# Patient Record
Sex: Male | Born: 1990 | Race: Black or African American | Hispanic: No | Marital: Single | Smoking: Current some day smoker
Health system: Southern US, Community
[De-identification: ages and names within clinical notes are randomized; demographics above are authoritative.]

## PROBLEM LIST (undated history)

## (undated) DIAGNOSIS — Y249XXA Unspecified firearm discharge, undetermined intent, initial encounter: Secondary | ICD-10-CM

## (undated) DIAGNOSIS — W3400XA Accidental discharge from unspecified firearms or gun, initial encounter: Secondary | ICD-10-CM

---
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
## 2016-07-10 ENCOUNTER — Emergency Department (HOSPITAL_COMMUNITY): Payer: Self-pay

## 2016-07-10 ENCOUNTER — Encounter (HOSPITAL_COMMUNITY): Payer: Self-pay | Admitting: Emergency Medicine

## 2016-07-10 ENCOUNTER — Emergency Department (HOSPITAL_COMMUNITY)
Admission: EM | Admit: 2016-07-10 | Discharge: 2016-07-11 | Disposition: A | Discharge status: Home / Self Care | Payer: Self-pay | Attending: Emergency Medicine | Admitting: Emergency Medicine

## 2016-07-10 DIAGNOSIS — W3400XA Accidental discharge from unspecified firearms or gun, initial encounter: Secondary | ICD-10-CM | POA: Insufficient documentation

## 2016-07-10 DIAGNOSIS — Y939 Activity, unspecified: Secondary | ICD-10-CM | POA: Insufficient documentation

## 2016-07-10 DIAGNOSIS — Y999 Unspecified external cause status: Secondary | ICD-10-CM | POA: Insufficient documentation

## 2016-07-10 DIAGNOSIS — F17200 Nicotine dependence, unspecified, uncomplicated: Secondary | ICD-10-CM | POA: Insufficient documentation

## 2016-07-10 DIAGNOSIS — Y929 Unspecified place or not applicable: Secondary | ICD-10-CM | POA: Insufficient documentation

## 2016-07-10 DIAGNOSIS — S91002A Unspecified open wound, left ankle, initial encounter: Secondary | ICD-10-CM | POA: Insufficient documentation

## 2016-07-10 MED ORDER — CEPHALEXIN 500 MG PO CAPS: 500.0000 mg | ORAL_CAPSULE | Freq: Four times a day (QID) | ORAL | 0 refills | Status: DC

## 2016-07-10 MED ORDER — HYDROCODONE-ACETAMINOPHEN 5-325 MG PO TABS: 1.0000 | ORAL_TABLET | Freq: Four times a day (QID) | ORAL | 0 refills | Status: DC | PRN

## 2016-07-10 MED ORDER — CEFAZOLIN IN D5W 1 GM/50ML IV SOLN
1.0000 g | Freq: Once | INTRAVENOUS | Status: AC
  Administered 2016-07-10: 1 g via INTRAVENOUS
  Filled 2016-07-10: qty 50

## 2016-07-10 MED ORDER — NAPROXEN 500 MG PO TABS: 500.0000 mg | ORAL_TABLET | Freq: Two times a day (BID) | ORAL | 0 refills | Status: DC
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
## 2016-07-10 NOTE — ED Notes (Signed)
Patient transported to X-ray 

## 2016-07-10 NOTE — Discharge Instructions (Signed)
I spoke with Dr Veda CanningSwintek this evening.  He reviewed your xray findings.  Please call his office to follow up with one of his colleagues on Monday.  He suggested seeing Dr Victorino DikeHewitt.

## 2016-07-10 NOTE — ED Notes (Signed)
Patient transported to CT 

## 2017-12-07 IMAGING — DX DG ANKLE COMPLETE 3+V*L*
3 series · 3 of 3 positions shown · non-contrast
Comparison: None.

CLINICAL DATA: Bleeding and pain left foot and ankle

EXAM:
LEFT ANKLE COMPLETE - 3+ VIEW

[ankle ap]
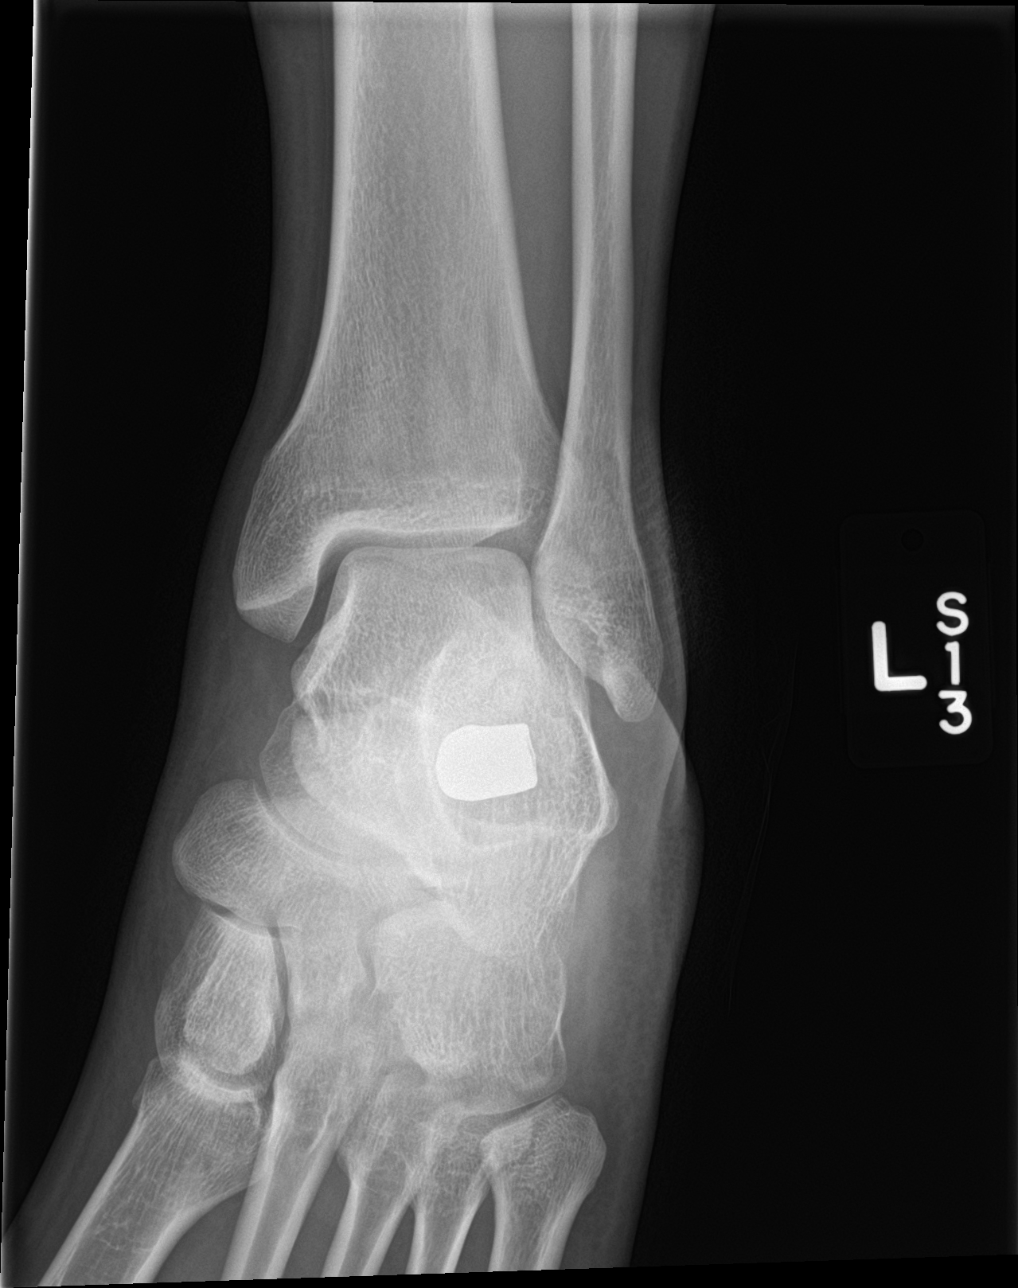

[ankle obl]
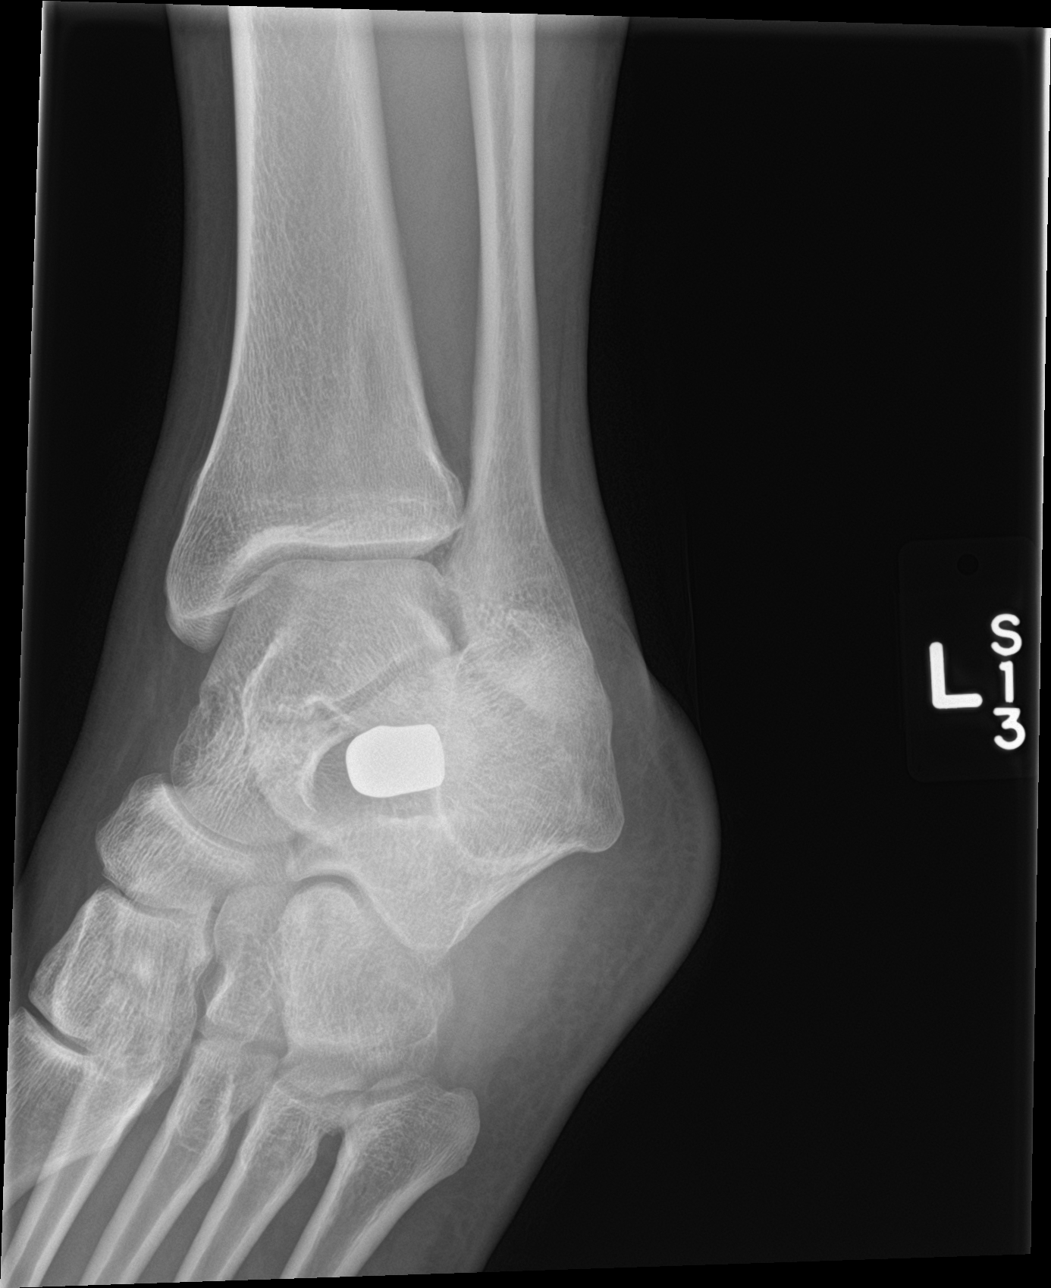

[ankle lat]
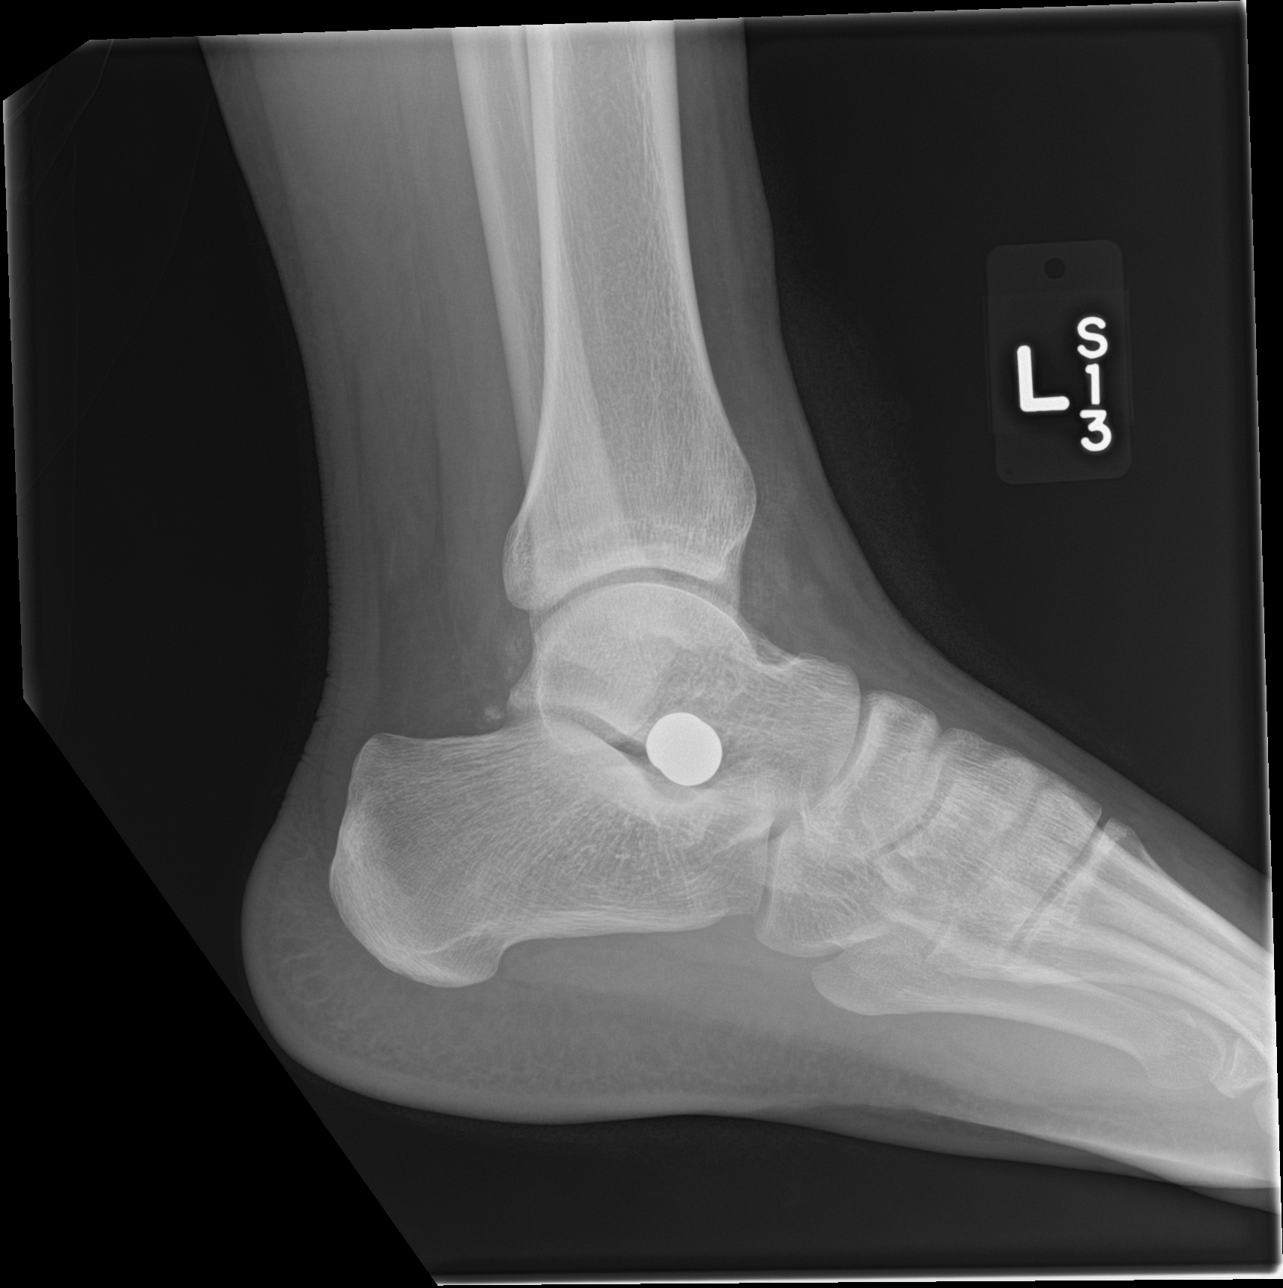

[3 of 3 positions shown; findings below may reference images not displayed]

FINDINGS: No acute fracture or dislocation at the left ankle joint. 1.5 cm
metallic foreign body projects over the talocalcaneal interval in
the region of sinus tarsi. Tiny calcifications posterior to the
talus may represent ossicle or nonspecific soft tissue
calcification.
IMPRESSION: No acute displaced fracture. Metallic density foreign body projects
over the talocalcaneal interview, in the region of sinus tarsi.

## 2017-12-07 IMAGING — DX DG FOOT COMPLETE 3+V*L*
3 series · 3 of 3 positions shown · non-contrast
Comparison: None.

CLINICAL DATA: Bleeding and pain left foot

EXAM:
LEFT FOOT - COMPLETE 3+ VIEW

[foot ap]
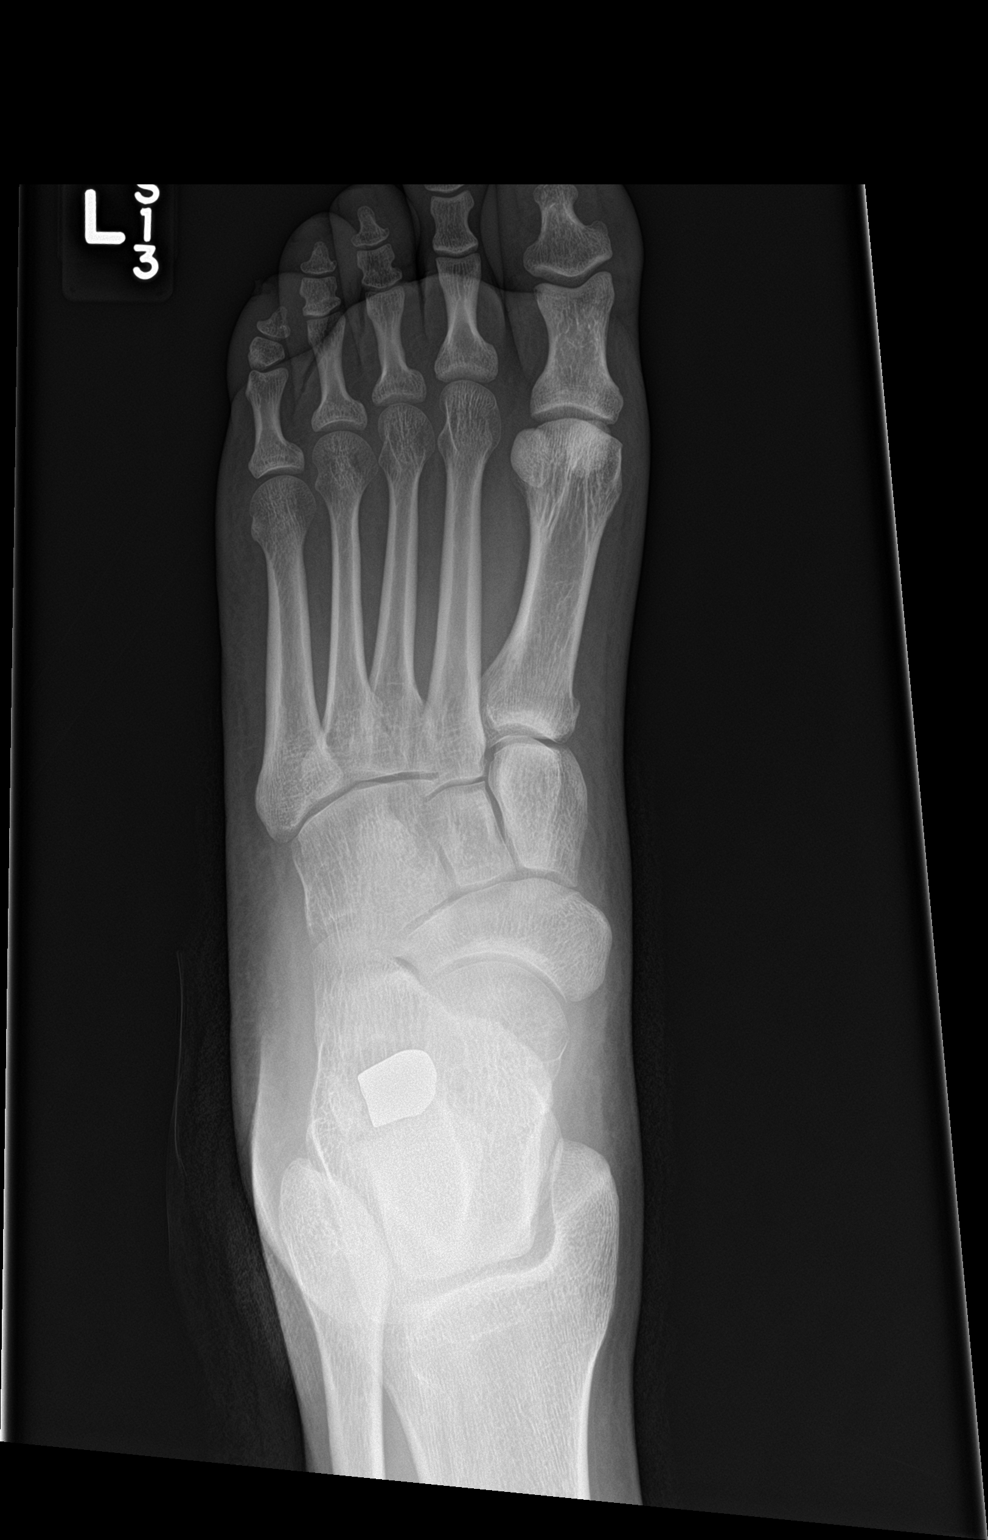

[foot obl]
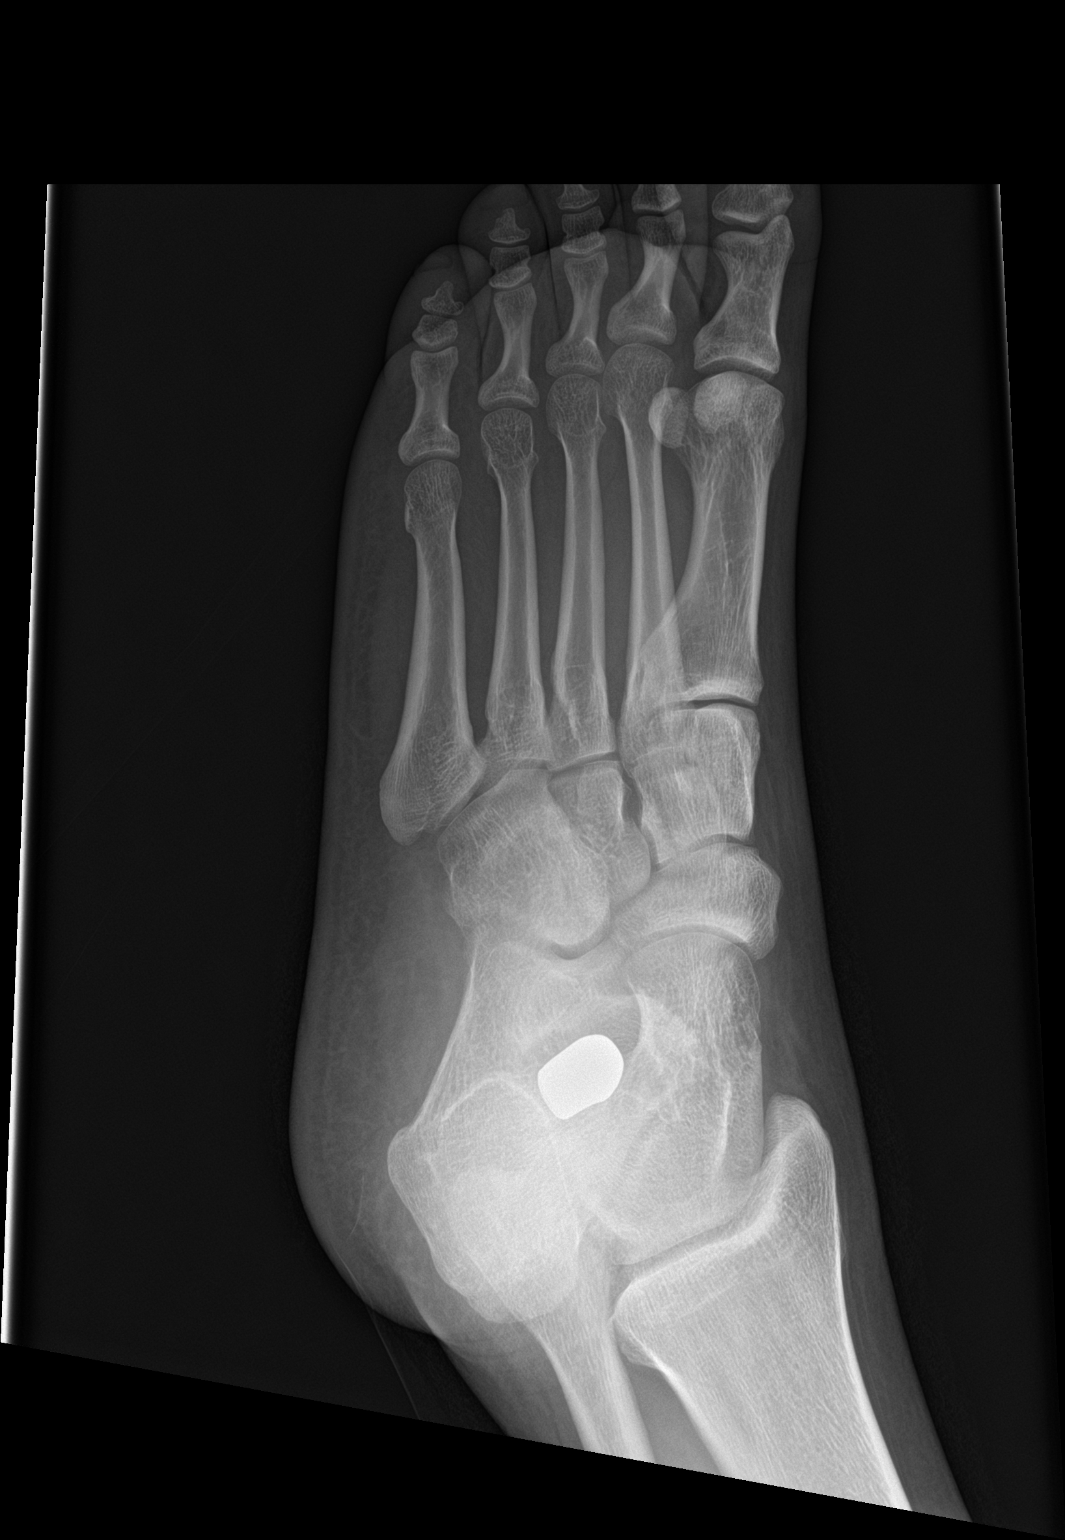

[foot lat]
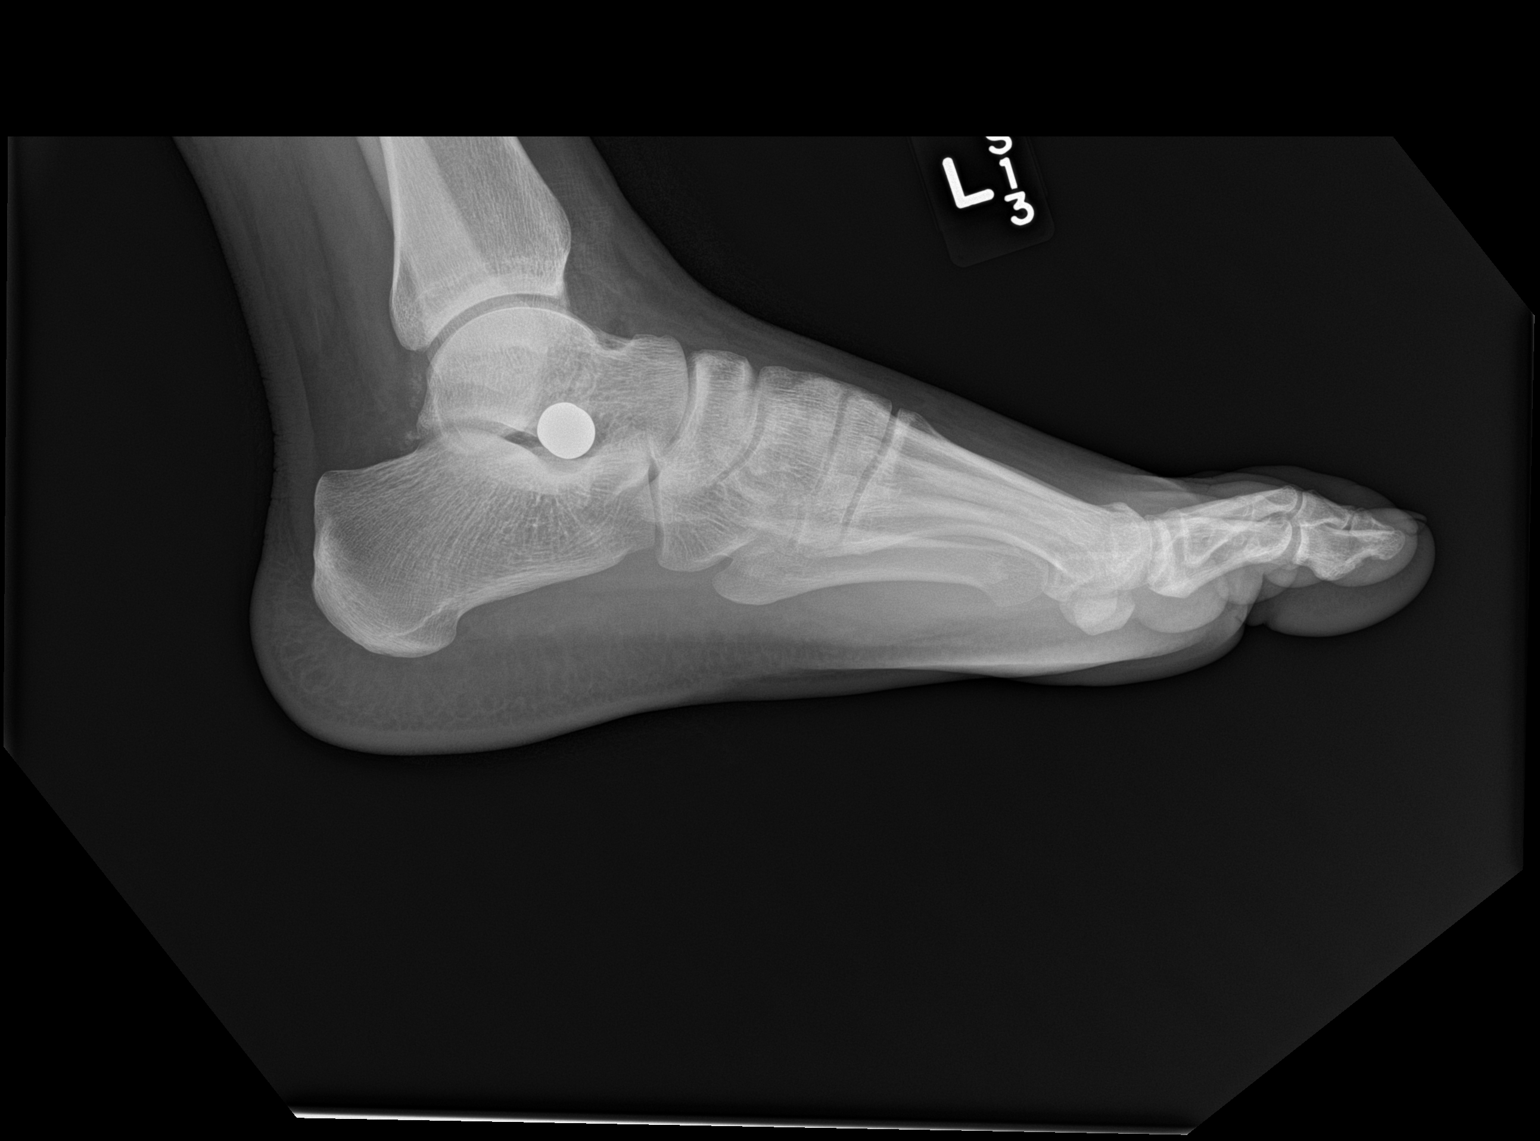

[3 of 3 positions shown; findings below may reference images not displayed]

FINDINGS: No acute displaced fracture or malalignment. Tiny calcifications
posterior to the talus. Metallic density foreign body projects over
the region of sinus tarsi.
IMPRESSION: No acute fracture or malalignment. Metallic density foreign body
between the calcaneus and the talus

## 2022-11-08 ENCOUNTER — Ambulatory Visit (HOSPITAL_COMMUNITY)
Admission: EM | Admit: 2022-11-08 | Discharge: 2022-11-08 | Disposition: A | Discharge status: Home / Self Care | Payer: BLUE CROSS/BLUE SHIELD | Attending: Family Medicine | Admitting: Family Medicine

## 2022-11-08 ENCOUNTER — Encounter (HOSPITAL_COMMUNITY): Payer: Self-pay | Admitting: Emergency Medicine

## 2022-11-08 DIAGNOSIS — K0889 Other specified disorders of teeth and supporting structures: Secondary | ICD-10-CM | POA: Diagnosis not present

## 2022-11-08 HISTORY — DX: Accidental discharge from unspecified firearms or gun, initial encounter: W34.00XA

## 2022-11-08 HISTORY — DX: Unspecified firearm discharge, undetermined intent, initial encounter: Y24.9XXA

## 2022-11-08 MED ORDER — HYDROCODONE-ACETAMINOPHEN 5-325 MG PO TABS: 1.0000 | ORAL_TABLET | Freq: Four times a day (QID) | ORAL | 0 refills | Status: DC | PRN

## 2022-11-08 MED ORDER — CLINDAMYCIN HCL 300 MG PO CAPS: 300.0000 mg | ORAL_CAPSULE | Freq: Three times a day (TID) | ORAL | 0 refills | Status: DC

## 2022-11-08 NOTE — Discharge Instructions (Signed)

## 2022-11-08 NOTE — ED Triage Notes (Signed)
Pt c/o left dental pain and swelling since yesterday.  
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
## 2023-01-30 ENCOUNTER — Ambulatory Visit: Payer: BLUE CROSS/BLUE SHIELD

## 2023-07-25 ENCOUNTER — Ambulatory Visit: Payer: BLUE CROSS/BLUE SHIELD

## 2024-04-01 ENCOUNTER — Encounter (HOSPITAL_COMMUNITY): Payer: Self-pay

## 2024-04-01 ENCOUNTER — Ambulatory Visit (HOSPITAL_COMMUNITY)
Admission: RE | Admit: 2024-04-01 | Discharge: 2024-04-01 | Disposition: A | Discharge status: Home / Self Care | Source: Ambulatory Visit | Attending: Emergency Medicine | Admitting: Emergency Medicine

## 2024-04-01 VITALS — BP 144/94 | HR 97 | Temp 99.7°F | Resp 16

## 2024-04-01 DIAGNOSIS — K047 Periapical abscess without sinus: Secondary | ICD-10-CM

## 2024-04-01 MED ORDER — AMOXICILLIN-POT CLAVULANATE 875-125 MG PO TABS: 1.0000 | ORAL_TABLET | Freq: Two times a day (BID) | ORAL | 0 refills | Status: AC

## 2024-04-01 MED ORDER — IBUPROFEN 800 MG PO TABS: 800.0000 mg | ORAL_TABLET | Freq: Three times a day (TID) | ORAL | 0 refills | Status: AC

## 2024-04-01 NOTE — ED Triage Notes (Addendum)
 Patient here today with c/o right lower dental pain and swelling X 2-3 days. He has taken Tylenol  and Ibuprofen with some relief.
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
## 2024-04-01 NOTE — Discharge Instructions (Signed)
 Take the antibiotics twice daily with food for the next 7 days.  You can take 800 mg of ibuprofen every 8 hours to help with pain and swelling.  Ice may help as well.  With your broken tooth, the antibiotics will treat the active infection.  This needs to be evaluated and managed by a dentist, without proper evaluation the infection will keep reoccurring.   untreated infections can lead to sepsis and death.  Below are some local dental resources.  [facility] in [place], [place]  Address: [address] [place], [place] [address] Phone: [phone]  [facility] [phone] extension [phone] [address]  Dr. Encarnacion [phone] [address]  [employer] [phone] [address] [facility] [place].  [facility] [phone] extension [phone] [place], [place], [address] First come first serve for the first 10 clients.  May do simple extractions only, no wisdom teeth or surgery.  You may try the second for Thursday of the month starting at 6:30 AM.  [facility] You may call the school to see if they are still helping to provide dental care for emergent cases.
